# Patient Record
Sex: Male | Born: 1980 | Race: White | Hispanic: No | Marital: Single | Smoking: Current every day smoker
Health system: Southern US, Community
[De-identification: ages and names within clinical notes are randomized; demographics above are authoritative.]

---

## 2009-09-15 ENCOUNTER — Emergency Department (HOSPITAL_COMMUNITY): Admission: EM | Admit: 2009-09-15 | Discharge: 2009-09-15 | Payer: Self-pay | Admitting: Emergency Medicine

## 2015-04-09 ENCOUNTER — Encounter (HOSPITAL_COMMUNITY): Payer: Self-pay | Admitting: *Deleted

## 2015-04-09 ENCOUNTER — Emergency Department (HOSPITAL_COMMUNITY): Payer: Medicaid Other

## 2015-04-09 ENCOUNTER — Emergency Department (HOSPITAL_COMMUNITY)
Admission: EM | Admit: 2015-04-09 | Discharge: 2015-04-10 | Disposition: A | Discharge status: Home / Self Care | Payer: Medicaid Other | Attending: Emergency Medicine | Admitting: Emergency Medicine

## 2015-04-09 DIAGNOSIS — S4991XA Unspecified injury of right shoulder and upper arm, initial encounter: Secondary | ICD-10-CM

## 2015-04-09 DIAGNOSIS — Z720 Tobacco use: Secondary | ICD-10-CM | POA: Diagnosis not present

## 2015-04-09 DIAGNOSIS — Y9389 Activity, other specified: Secondary | ICD-10-CM | POA: Insufficient documentation

## 2015-04-09 DIAGNOSIS — Y92007 Garden or yard of unspecified non-institutional (private) residence as the place of occurrence of the external cause: Secondary | ICD-10-CM | POA: Insufficient documentation

## 2015-04-09 DIAGNOSIS — Y998 Other external cause status: Secondary | ICD-10-CM | POA: Insufficient documentation

## 2015-04-09 MED ORDER — IBUPROFEN 800 MG PO TABS
800.0000 mg | ORAL_TABLET | Freq: Once | ORAL | Status: AC
  Administered 2015-04-09: 800 mg via ORAL
  Filled 2015-04-09: qty 1

## 2015-04-09 MED ORDER — IBUPROFEN 800 MG PO TABS: 800.0000 mg | ORAL_TABLET | Freq: Three times a day (TID) | ORAL | Status: AC
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
## 2015-04-09 NOTE — Discharge Instructions (Signed)
Take ibuprofen as needed for pain. Wear shoulder sling as needed for support. Refer to attached documents for more information.
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
## 2015-05-01 ENCOUNTER — Emergency Department (HOSPITAL_COMMUNITY)
Admission: EM | Admit: 2015-05-01 | Discharge: 2015-05-01 | Disposition: A | Discharge status: Home / Self Care | Payer: Medicaid Other | Attending: Emergency Medicine | Admitting: Emergency Medicine

## 2015-05-01 ENCOUNTER — Encounter (HOSPITAL_COMMUNITY): Payer: Self-pay

## 2015-05-01 DIAGNOSIS — Z791 Long term (current) use of non-steroidal anti-inflammatories (NSAID): Secondary | ICD-10-CM | POA: Insufficient documentation

## 2015-05-01 DIAGNOSIS — X58XXXA Exposure to other specified factors, initial encounter: Secondary | ICD-10-CM | POA: Insufficient documentation

## 2015-05-01 DIAGNOSIS — S0501XA Injury of conjunctiva and corneal abrasion without foreign body, right eye, initial encounter: Secondary | ICD-10-CM | POA: Diagnosis not present

## 2015-05-01 DIAGNOSIS — Y990 Civilian activity done for income or pay: Secondary | ICD-10-CM | POA: Insufficient documentation

## 2015-05-01 DIAGNOSIS — S0591XA Unspecified injury of right eye and orbit, initial encounter: Secondary | ICD-10-CM | POA: Diagnosis present

## 2015-05-01 DIAGNOSIS — Z720 Tobacco use: Secondary | ICD-10-CM | POA: Insufficient documentation

## 2015-05-01 DIAGNOSIS — Y9389 Activity, other specified: Secondary | ICD-10-CM | POA: Insufficient documentation

## 2015-05-01 DIAGNOSIS — Y9289 Other specified places as the place of occurrence of the external cause: Secondary | ICD-10-CM | POA: Insufficient documentation

## 2015-05-01 MED ORDER — HYDROCODONE-ACETAMINOPHEN 5-325 MG PO TABS: 2.0000 | ORAL_TABLET | ORAL | Status: AC | PRN

## 2015-05-01 MED ORDER — CIPROFLOXACIN HCL 0.3 % OP SOLN
1.0000 [drp] | OPHTHALMIC | Status: DC
  Filled 2015-05-01: qty 2.5

## 2015-05-01 MED ORDER — PROPARACAINE HCL 0.5 % OP SOLN
OPHTHALMIC | Status: AC
  Administered 2015-05-01: 2 [drp]
  Filled 2015-05-01: qty 15

## 2015-05-01 MED ORDER — FLUORESCEIN SODIUM 1 MG OP STRP
ORAL_STRIP | OPHTHALMIC | Status: AC
  Administered 2015-05-01: 16:00:00
  Filled 2015-05-01: qty 2

## 2015-05-01 MED ORDER — ERYTHROMYCIN 5 MG/GM OP OINT
1.0000 "application " | TOPICAL_OINTMENT | Freq: Once | OPHTHALMIC | Status: AC
  Administered 2015-05-01: 1 via OPHTHALMIC
  Filled 2015-05-01: qty 3.5

## 2015-05-01 MED ORDER — TETRACAINE HCL 0.5 % OP SOLN
1.0000 [drp] | Freq: Once | OPHTHALMIC | Status: AC
  Administered 2015-05-01: 1 [drp] via OPHTHALMIC

## 2015-05-01 MED ORDER — NAPROXEN 500 MG PO TABS: 500.0000 mg | ORAL_TABLET | Freq: Two times a day (BID) | ORAL | Status: AC

## 2015-05-01 MED ORDER — TETRACAINE HCL 0.5 % OP SOLN
2.0000 [drp] | Freq: Once | OPHTHALMIC | Status: AC
  Administered 2015-05-01: 2 [drp] via OPHTHALMIC
  Filled 2015-05-01: qty 2
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
## 2015-05-01 NOTE — ED Provider Notes (Signed)
CSN: [id number]     Arrival date & time 05/01/15  1452 History   First MD Initiated Contact with Patient 05/01/15 1538     Chief Complaint  Patient presents with  . Chemical Exposure     (Consider location/radiation/quality/duration/timing/severity/associated sxs/prior Treatment) HPI Comments: The patient is a 34 year old male with no prior ophthalmologic history who presents immediately after spraying a chemical in his right eye. 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 He immediately had acute onset of pain and redness and washed his eye out at an eyewash station for over 15 minutes. The pain is now moderate, persistent, nothing makes this better, worse with palpation, associated with mild blurred vision.  He has no history of using contact lenses or corrective lenses  The history is provided by the patient.    History reviewed. No pertinent past medical history. History reviewed. No pertinent past surgical history. History reviewed. No pertinent family history. History  Substance Use Topics  . Smoking status: Current Every Day Smoker -- 1.00 packs/day    Types: Cigarettes  . Smokeless tobacco: Not on file  . Alcohol Use: Yes     Comment: socially    Review of Systems  Eyes: Positive for pain and redness.  Gastrointestinal: Negative for nausea.      Allergies  Review of patient's allergies indicates no known allergies.  Home Medications   Prior to Admission medications   Medication Sig Start Date End Date Taking? Authorizing Provider  HYDROcodone-acetaminophen (NORCO/VICODIN) 5-325 MG per tablet Take 2 tablets by mouth every 4 (four) hours as needed. 05/01/15   Eber Hong, MD  ibuprofen (ADVIL,MOTRIN) 800 MG tablet Take 1 tablet (800 mg total) by mouth 3 (three) times daily. 04/09/15   Kaitlyn Szekalski, PA-C  naproxen (NAPROSYN) 500 MG tablet Take 1  tablet (500 mg total) by mouth 2 (two) times daily with a meal. 05/01/15   Eber Hong, MD   BP 123/86 mmHg  Pulse 54  Temp(Src) 98.6 F (37 C) (Oral)  Resp 20  SpO2 100% Physical Exam  Constitutional: He appears well-developed and well-nourished.  HENT:  Head: Normocephalic and atraumatic.  Eyes: Right eye exhibits no discharge. Left eye exhibits no discharge.  Pupillary reactive bilaterally, no asymmetry, mild conjunctival injection on the right, normal extraocular movements, no a fair pupillary defect, no consensual pain, under tetracaine and floor seen exam there is a corneal abrasion on the right overlying the cornea, there is no fluid flowing from the eye  Pulmonary/Chest: Effort normal. No respiratory distress.  Neurological: He is alert. Coordination normal.  Skin: Skin is warm and dry. No rash noted. He is not diaphoretic. No erythema.  Psychiatric: He has a normal mood and affect.  Nursing note and vitals reviewed.   ED Course  Procedures (including critical care time) Labs Review Labs Reviewed - No data to display  Imaging Review No results found.   MDM   Final diagnoses:  Corneal abrasion, right, initial encounter    Left eye normal, right eye with corneal abrasion likely from the chemical, the pH in the bilateral eyes is   between 7 and 8.  Discuss with ophthalmology, erythromycin, anticipate discharge.   Dr. Dione Booze will see tomorrow at 10 AM  Meds given in ED:  Medications  fluorescein 1 MG ophthalmic strip (not administered)  ciprofloxacin (CILOXAN) 0.3 % ophthalmic solution 1 drop (  not administered)  tetracaine (PONTOCAINE) 0.5 % ophthalmic solution 1 drop (not administered)  erythromycin ophthalmic ointment 1 application (not administered)  tetracaine (PONTOCAINE) 0.5 % ophthalmic solution 2 drop (2 drops Both Eyes Given 05/01/15 1531)    New Prescriptions   HYDROCODONE-ACETAMINOPHEN (NORCO/VICODIN) 5-325 MG PER TABLET    Take 2 tablets by mouth  every 4 (four) hours as needed.   NAPROXEN (NAPROSYN) 500 MG TABLET    Take 1 tablet (500 mg total) by mouth 2 (two) times daily with a meal.      Eber HongBrian Gavriela Cashin, MD 05/01/15 [phone]

## 2015-05-01 NOTE — ED Notes (Signed)
Pt reports chemical exposure to R eye and skin while he was at work today.  Pt reports being exposed to fiberglass Resin.  Redness noted to R eye and drainage.  Pt reports blurred vision.

## 2015-05-01 NOTE — Discharge Instructions (Signed)
Please read the attached instructions.  Use the ointment on your eye every 4 hours  Please obtain all of your results from medical records or have your doctors office obtain the results - share them with your doctor - you should be seen at your doctors office in the next 2 days. Call today to arrange your follow up. Take the medications as prescribed. Please review all of the medicines and only take them if you do not have an allergy to them. Please be aware that if you are taking birth control pills, taking other prescriptions, ESPECIALLY ANTIBIOTICS may make the birth control ineffective - if this is the case, either do not engage in sexual activity or use alternative methods of birth control such as condoms until you have finished the medicine and your family doctor says it is OK to restart them. If you are on a blood thinner such as COUMADIN, be aware that any other medicine that you take may cause the coumadin to either work too much, or not enough - you should have your coumadin level rechecked in next 7 days if this is the case.  ?  It is also a possibility that you have an allergic reaction to any of the medicines that you have been prescribed - Everybody reacts differently to medications and while MOST people have no trouble with most medicines, you may have a reaction such as nausea, vomiting, rash, swelling, shortness of breath. If this is the case, please stop taking the medicine immediately and contact your physician.  ?  You should return to the ER if you develop severe or worsening symptoms.

## 2017-10-31 ENCOUNTER — Other Ambulatory Visit: Payer: Self-pay

## 2017-10-31 ENCOUNTER — Emergency Department (HOSPITAL_COMMUNITY): Payer: Self-pay

## 2017-10-31 ENCOUNTER — Encounter (HOSPITAL_COMMUNITY): Payer: Self-pay

## 2017-10-31 ENCOUNTER — Emergency Department (HOSPITAL_COMMUNITY)
Admission: EM | Admit: 2017-10-31 | Discharge: 2017-10-31 | Disposition: A | Discharge status: Home / Self Care | Payer: Self-pay | Attending: Emergency Medicine | Admitting: Emergency Medicine

## 2017-10-31 DIAGNOSIS — R160 Hepatomegaly, not elsewhere classified: Secondary | ICD-10-CM | POA: Insufficient documentation

## 2017-10-31 DIAGNOSIS — R590 Localized enlarged lymph nodes: Secondary | ICD-10-CM | POA: Insufficient documentation

## 2017-10-31 DIAGNOSIS — F17210 Nicotine dependence, cigarettes, uncomplicated: Secondary | ICD-10-CM | POA: Insufficient documentation

## 2017-10-31 LAB — URINALYSIS, ROUTINE W REFLEX MICROSCOPIC
Glucose, UA: NEGATIVE mg/dL
HGB URINE DIPSTICK: NEGATIVE
Ketones, ur: NEGATIVE mg/dL
LEUKOCYTES UA: NEGATIVE
Nitrite: NEGATIVE
PH: 6 (ref 5.0–8.0)
Protein, ur: 100 mg/dL — AB
Specific Gravity, Urine: 1.036 — ABNORMAL HIGH (ref 1.005–1.030)

## 2017-10-31 LAB — CBC WITH DIFFERENTIAL/PLATELET
BASOS PCT: 0 %
Basophils Absolute: 0 10*3/uL (ref 0.0–0.1)
Eosinophils Absolute: 0.1 10*3/uL (ref 0.0–0.7)
Eosinophils Relative: 0 %
HEMATOCRIT: 46.4 % (ref 39.0–52.0)
Hemoglobin: 16.2 g/dL (ref 13.0–17.0)
LYMPHS ABS: 2.6 10*3/uL (ref 0.7–4.0)
Lymphocytes Relative: 20 %
MCH: 33.1 pg (ref 26.0–34.0)
MCHC: 34.9 g/dL (ref 30.0–36.0)
MCV: 94.9 fL (ref 78.0–100.0)
MONOS PCT: 9 %
Monocytes Absolute: 1.1 10*3/uL — ABNORMAL HIGH (ref 0.1–1.0)
NEUTROS ABS: 9.4 10*3/uL — AB (ref 1.7–7.7)
Neutrophils Relative %: 71 %
Platelets: 316 10*3/uL (ref 150–400)
RBC: 4.89 MIL/uL (ref 4.22–5.81)
RDW: 13.3 % (ref 11.5–15.5)
WBC: 13.1 10*3/uL — ABNORMAL HIGH (ref 4.0–10.5)

## 2017-10-31 LAB — BASIC METABOLIC PANEL
Anion gap: 9 (ref 5–15)
BUN: 12 mg/dL (ref 6–20)
CALCIUM: 9.1 mg/dL (ref 8.9–10.3)
CO2: 27 mmol/L (ref 22–32)
Chloride: 102 mmol/L (ref 101–111)
Creatinine, Ser: 1.04 mg/dL (ref 0.61–1.24)
GFR calc Af Amer: >60 mL/min (ref >60)
GFR calc non Af Amer: >60 mL/min (ref >60)
Glucose, Bld: 97 mg/dL (ref 65–99)
Potassium: 4.6 mmol/L (ref 3.5–5.1)
Sodium: 138 mmol/L (ref 135–145)

## 2017-10-31 MED ORDER — IOPAMIDOL (ISOVUE-300) INJECTION 61%
INTRAVENOUS | Status: AC
  Administered 2017-10-31: 75 mL via INTRAVENOUS
  Filled 2017-10-31: qty 100

## 2017-10-31 NOTE — Discharge Planning (Signed)
Torien Ramroop J. Lucretia RoersWood, RN, BSN, [place] [phone]  [facility]00.  Spoke with pt at bedside and advised to please arrive 15 min early and take a picture ID and your current medications.  Pt verbalizes understanding of keeping appointment.

## 2017-10-31 NOTE — ED Notes (Signed)
Patient transported to CT 

## 2017-10-31 NOTE — ED Triage Notes (Signed)
Per Pt, Pt reports some pain in his right lower groin with a bulge that started on Saturday.

## 2017-10-31 NOTE — Discharge Instructions (Signed)
The area in the groin is noted to be an enlarged lymph node. There was no reason for this noted on the CT scan. Please watch the area to assure it does not worsen. Follow up with a primary care provider on this matter.  The liver was noted to be larger than normal. Follow up with the primary care provider on this matter.  Call the number provided to let them know that you will also need evaluation of an enlarged liver noted on CT and ask them if they want you to come in for lab tests before your first visit so the results can be discussed during your first visit. These may include hepatic function tests and possibly a hepatitis panel. It may also require follow-up with the gastroenterologist. Avoid alcohol and Tylenol.

## 2017-10-31 NOTE — ED Notes (Signed)
Patient returned to room from CT. 

## 2017-10-31 NOTE — ED Provider Notes (Signed)
[facility] EMERGENCY DEPARTMENT Provider Note   CSN: [id number] Arrival date & time: 10/31/17  1015     History   Chief Complaint Chief Complaint  Patient presents with  . Hernia    HPI Ronald Mcmahon is a 37 y.o. male.  HPI   Ronald Mcmahon is a 37 y.o. male, patient with no pertinent past medical history, presenting to the ED with an area of painful swelling to the right groin over the last 2 days.  Pain increases throughout the day and subsides at night.  Patient does endorse long work days restoring cars. Improved last night with naproxen and warm bath. No change in pain with squatting, increased abdominal pressure, coughing, or bowel movements.  Denies any other areas of swelling or pain.  Last tattoo was at least 2 years ago.  Denies illicit drug use other than marijuana.  States he uses alcohol less than once a month.  He does endorse about 15 pound unintentional weight loss over the last month that he attributes to stress from starting a new business.   Denies fever/chills, N/V/D, abdominal pain, scrotal pain or swelling, penile discharge, dysuria, leg pain, or any other complaints.   History reviewed. No pertinent past medical history.  There are no active problems to display for this patient.   History reviewed. No pertinent surgical history.     Home Medications    Prior to Admission medications   Medication Sig Start Date End Date Taking? Authorizing Provider  naproxen sodium (ALEVE) 220 MG tablet Take 440 mg by mouth daily as needed (pain).   Yes [provider]  HYDROcodone-acetaminophen (NORCO/VICODIN) 5-325 MG per tablet Take 2 tablets by mouth every 4 (four) hours as needed. Patient not taking: Reported on 10/31/2017 05/01/15   Eber Hong, MD  ibuprofen (ADVIL,MOTRIN) 800 MG tablet Take 1 tablet (800 mg total) by mouth 3 (three) times daily. Patient not taking: Reported on 05/01/2015 04/09/15   Emilia Beck, PA-C    naproxen (NAPROSYN) 500 MG tablet Take 1 tablet (500 mg total) by mouth 2 (two) times daily with a meal. Patient not taking: Reported on 10/31/2017 05/01/15   Eber Hong, MD    Family History No family history on file.  Social History Social History   Tobacco Use  . Smoking status: Current Every Day Smoker    Packs/day: 1.00    Types: Cigarettes  . Smokeless tobacco: Never Used  Substance Use Topics  . Alcohol use: Yes    Comment: socially  . Drug use: Yes    Types: Marijuana     Allergies   Patient has no known allergies.   Review of Systems Review of Systems  Constitutional: Negative for chills, diaphoresis and fever.  Respiratory: Negative for shortness of breath.   Cardiovascular: Negative for chest pain.  Gastrointestinal: Negative for abdominal pain, diarrhea, nausea and vomiting.  Genitourinary: Negative for discharge, dysuria, hematuria, penile swelling, scrotal swelling and testicular pain.       Swelling in the right groin.   Skin: Negative for rash.  Neurological: Negative for weakness and numbness.  All other systems reviewed and are negative.    Physical Exam Updated Vital Signs BP 115/79   Pulse 80   Temp 98.5 F (36.9 C) (Oral)   Resp 18   Ht 5\' 7"  (1.702 m)   Wt 68 kg (150 lb)   SpO2 98%   BMI 23.49 kg/m   Physical Exam  Constitutional: He appears well-developed and  well-nourished. No distress.  HENT:  Head: Normocephalic and atraumatic.  Eyes: Conjunctivae are normal. No scleral icterus.  Neck: Neck supple.  Cardiovascular: Normal rate, regular rhythm, normal heart sounds and intact distal pulses.  Pulmonary/Chest: Effort normal and breath sounds normal. No respiratory distress.  Abdominal: Soft. There is no tenderness. There is no guarding.  Genitourinary: Testes normal. Cremasteric reflex is present. Circumcised.     Genitourinary Comments: Approximately 2-2.5cm, firm, mobile, mildly tender mass in the right anterior inguinal  region. There is no change in the size of the mass or in patient's discomfort with straining, coughing, or change in position.  There is no fluctuance noted to the mass.  No hernia noted into the inguinal canal. No mass noted to the testicles.  Penis, scrotum, and testicles without swelling, lesions, or tenderness. No penile discharge. Cremasteric reflex intact. Overall normal male genitalia.  Musculoskeletal: He exhibits no edema or tenderness.  Full range of motion in the right hip and knee without pain.  No noted erythema, swelling, tenderness, or increased warmth to the right leg. No noted wounds, fresh tattoos, or other breaks in the skin.  Lymphadenopathy:    He has no cervical adenopathy. Inguinal adenopathy noted on the right side.  Neurological: He is alert.  Skin: Skin is warm and dry. Capillary refill takes less than 2 seconds. He is not diaphoretic. No pallor.  No jaundice.  Psychiatric: He has a normal mood and affect. His behavior is normal.  Nursing note and vitals reviewed.    ED Treatments / Results  Labs (all labs ordered are listed, but only abnormal results are displayed) Labs Reviewed  CBC WITH DIFFERENTIAL/PLATELET - Abnormal; Notable for the following components:      Result Value   WBC 13.1 (*)    Neutro Abs 9.4 (*)    Monocytes Absolute 1.1 (*)    All other components within normal limits  URINALYSIS, ROUTINE W REFLEX MICROSCOPIC - Abnormal; Notable for the following components:   Color, Urine AMBER (*)    APPearance CLOUDY (*)    Specific Gravity, Urine 1.036 (*)    Bilirubin Urine SMALL (*)    Protein, ur 100 (*)    Bacteria, UA RARE (*)    Squamous Epithelial / LPF 0-5 (*)    All other components within normal limits  BASIC METABOLIC PANEL    EKG  EKG Interpretation None       Radiology Ct Abdomen Pelvis W Contrast  Addendum Date: 10/31/2017   ADDENDUM REPORT: 10/31/2017 15:27 ADDENDUM: Body of the report should read "NO abdominal aortic  aneurysm". Electronically Signed   By: Lacy Duverney M.D.   On: 10/31/2017 15:27   Result Date: 10/31/2017 CLINICAL DATA:  37 year old male with right groin pain and swelling over the past 2 days. Initial encounter. EXAM: CT ABDOMEN AND PELVIS WITH CONTRAST TECHNIQUE: Multidetector CT imaging of the abdomen and pelvis was performed using the standard protocol following bolus administration of intravenous contrast. CONTRAST:  75 cc ISOVUE-300 IOPAMIDOL (ISOVUE-300) INJECTION 61% COMPARISON:  None. FINDINGS: Lower chest: Minimal basilar atelectasis. Heart size within normal limits. Hepatobiliary: Enlarged liver spanning over 19.8 cm. No worrisome hepatic lesion. No calcified gallstone or common bile duct stone. Pancreas: No pancreatic mass or inflammation. Spleen: No splenic mass or enlargement. Adrenals/Urinary Tract: No obstructing stone or hydronephrosis. Left upper pole 6 mm low-density structure too small to characterize, statistically likely a cyst. No adrenal lesion. Noncontrast filled views the urinary bladder unremarkable. Stomach/Bowel: No extraluminal bowel  inflammatory process. Under distended distal esophagus. No gastric abnormality. Vascular/Lymphatic: Abdominal aortic aneurysm. Trace calcified plaque internal iliac arteries bilaterally. Increase number of top-normal size right inguinal lymph nodes of questionable etiology. Reproductive: There may be small hydroceles Other: No free intraperitoneal air.  No bowel containing hernia. Musculoskeletal: No worrisome osseous abnormality. IMPRESSION: Increase number of top-normal size right inguinal lymph nodes of questionable etiology. No extraluminal bowel inflammatory process. No bowel containing hernia. There may be small hydroceles. Trace calcified plaque internal iliac arteries. Left renal upper pole 6 mm low-density structure too small to characterize, statistically likely a cyst. Enlarged liver spanning over 19.8 cm. Electronically Signed: By: Lacy DuverneySteven   Olson M.D. On: 10/31/2017 15:08    Procedures Procedures (including critical care time)  Medications Ordered in ED Medications  iopamidol (ISOVUE-300) 61 % injection (75 mLs Intravenous Contrast Given 10/31/17 1438)     Initial Impression / Assessment and Plan / ED Course  I have reviewed the triage vital signs and the nursing notes.  Pertinent labs & imaging results that were available during my care of the patient were reviewed by me and considered in my medical decision making (see chart for details).  Clinical Course as of Oct 31 1550  Mon Oct 31, 2017  1537 Hepatomegaly noted on CT scan. Patient has no right upper quadrant pain or tenderness.  No jaundice or muscle aches. Hepatic function testing discussed with the patient.  Patient opted to have this done through PCP.  Patient counseled to avoid alcohol and Tylenol. CT Abdomen Pelvis W Contrast [[facility]]    Clinical Course User Index [[facility]] Joy, Shawn C, PA-C    Patient presents with a tender mass to the right inguinal region.  Suspect lymphadenopathy; confirmed on CT.  No evidence of cellulitis, lymphangitis, or other abnormality to the lower extremity.  Abdominal exam benign.  Case management was used to secure PCP appointment.   Patient to follow-up with PCP versus GI for hepatomegaly noted incidentally on CT. The patient was given instructions for home care as well as return precautions. Patient voices understanding of these instructions, accepts the plan, and is comfortable with discharge.  Findings and plan of care discussed with Gwyneth SproutWhitney Plunkett, MD. Dr. Anitra LauthPlunkett personally evaluated and examined this patient.  Vitals:   10/31/17 1400 10/31/17 1500 10/31/17 1515 10/31/17 1530  BP: 132/72 120/72 122/85 117/82  Pulse: 82 61 97 69  Resp:      Temp:      TempSrc:      SpO2: 99% 98% 100% 98%  Weight:      Height:         Final Clinical Impressions(s) / ED Diagnoses   Final diagnoses:  Inguinal lymphadenopathy    Hepatomegaly    ED Discharge Orders    None       Concepcion LivingJoy, Shawn C, PA-C 10/31/17 1606    Gwyneth SproutPlunkett, Whitney, MD 11/01/17 1041

## 2017-10-31 NOTE — ED Notes (Signed)
Registration at bedside at this time.

## 2017-11-15 ENCOUNTER — Encounter: Payer: Self-pay | Admitting: Family Medicine

## 2017-11-15 ENCOUNTER — Ambulatory Visit (INDEPENDENT_AMBULATORY_CARE_PROVIDER_SITE_OTHER): Payer: Self-pay | Admitting: Family Medicine

## 2017-11-15 VITALS — BP 126/70 | HR 74 | Temp 98.8°F | Resp 14 | Ht 67.0 in | Wt 156.0 lb

## 2017-11-15 DIAGNOSIS — R160 Hepatomegaly, not elsewhere classified: Secondary | ICD-10-CM

## 2017-11-15 DIAGNOSIS — Z1329 Encounter for screening for other suspected endocrine disorder: Secondary | ICD-10-CM

## 2017-11-15 DIAGNOSIS — Z23 Encounter for immunization: Secondary | ICD-10-CM

## 2017-11-15 DIAGNOSIS — R590 Localized enlarged lymph nodes: Secondary | ICD-10-CM

## 2017-11-15 DIAGNOSIS — Z131 Encounter for screening for diabetes mellitus: Secondary | ICD-10-CM

## 2017-11-15 LAB — POCT URINALYSIS DIP (DEVICE)
BILIRUBIN URINE: NEGATIVE
Glucose, UA: NEGATIVE mg/dL
HGB URINE DIPSTICK: NEGATIVE
Ketones, ur: NEGATIVE mg/dL
Leukocytes, UA: NEGATIVE
Nitrite: NEGATIVE
Protein, ur: NEGATIVE mg/dL
Specific Gravity, Urine: 1.015 (ref 1.005–1.030)
Urobilinogen, UA: 0.2 mg/dL (ref 0.0–1.0)
pH: 7 (ref 5.0–8.0)

## 2017-11-15 LAB — POCT GLYCOSYLATED HEMOGLOBIN (HGB A1C): Hemoglobin A1C: 5.6

## 2017-11-15 NOTE — Progress Notes (Signed)
Patient ID: Ronald Mcmahon, male    DOB: Feb 25, 1981, 37 y.o.   MRN: [id number]  PCP: Bing NeighborsHarris, Mako Pelfrey S, FNP  Chief Complaint  Patient presents with  . Establish Care  . Hospitalization Follow-up    Subjective:  HPI Ronald Mcmahon is a [age] y.o. male without significant medical history, current daily smoker, presents to establish care and for ED follow-up.  No routine primary preventative care in several years. On 10/31/2017 Ronald BootyJoshua presented to [facility] with a complaint of a hernia and was found to have  inguinal lymphadenopathy and hepatomegaly. Patient was found to have elevated liver enzymes. Within the last he reports losing weight without trying although attributed this weight loss to stress. He has a history of "heavy alcohol use" although denies any history of alcohol abuse, substance abuse, and or high risk sexual behavior. Reports being in a current long-term relationship no ne sexual partners. He denies any recent bruising, skin color changes, stool color changes, or abdominal pain. Reports bleeding of gums recently however reports he needs dental care. No known history of liver disease. Reports that inguinal lymphadenopathy has resolved.  Social History   Socioeconomic History  . Marital status: Single    Spouse name: Not on file  . Number of children: Not on file  . Years of education: Not on file  . Highest education level: Not on file  Social Needs  . Financial resource strain: Not on file  . Food insecurity - worry: Not on file  . Food insecurity - inability: Not on file  . Transportation needs - medical: Not on file  . Transportation needs - non-medical: Not on file  Occupational History  . Not on file  Tobacco Use  . Smoking status: Current Every Day Smoker    Packs/day: 1.00    Types: Cigarettes  . Smokeless tobacco: Never Used  Substance and Sexual Activity  . Alcohol use: Yes    Comment: socially  . Drug use: Yes    Types: Marijuana  . Sexual activity:  Not on file  Other Topics Concern  . Not on file  Social History Narrative  . Not on file    Family History  Problem Relation Age of Onset  . Hypercalcemia Mother   . Diabetes Father   . Hypertension Father   . Alzheimer's disease Maternal Grandfather    Review of Systems  Constitutional: Negative for fever, chills, diaphoresis, activity change, appetite change and fatigue. HENT: Negative for ear pain, nosebleeds, congestion, facial swelling, rhinorrhea, neck pain, neck stiffness and ear discharge.  Eyes: Negative for pain, discharge, redness, itching and visual disturbance. Respiratory: Negative for cough, choking, chest tightness, shortness of breath, wheezing and stridor.  Cardiovascular: Negative for chest pain, palpitations and leg swelling. Gastrointestinal: Negative for abdominal distention. Genitourinary: Negative for dysuria, urgency, frequency, hematuria, flank pain, decreased urine volume, difficulty urinating and dyspareunia.  Musculoskeletal: Negative for back pain, joint swelling, arthralgia and gait problem. Neurological: Negative for dizziness, tremors, seizures, syncope, facial asymmetry, speech difficulty, weakness, light-headedness, numbness and headaches.  Hematological: Negative for adenopathy. Does not bruise/bleed easily. Psychiatric/Behavioral: Negative for hallucinations, behavioral problems, confusion, dysphoric mood, decreased concentration and agitation.   No Known Allergies  Prior to Admission medications   Medication Sig Start Date End Date Taking? Authorizing Provider  naproxen sodium (ALEVE) 220 MG tablet Take 440 mg by mouth daily as needed (pain).   Yes [provider]  HYDROcodone-acetaminophen (NORCO/VICODIN) 5-325 MG per tablet Take 2 tablets by  mouth every 4 (four) hours as needed. Patient not taking: Reported on 10/31/2017 05/01/15   Eber Hong, MD  ibuprofen (ADVIL,MOTRIN) 800 MG tablet Take 1 tablet (800 mg total) by mouth 3 (three)  times daily. Patient not taking: Reported on 05/01/2015 04/09/15   Emilia Beck, PA-C  naproxen (NAPROSYN) 500 MG tablet Take 1 tablet (500 mg total) by mouth 2 (two) times daily with a meal. Patient not taking: Reported on 11/15/2017 05/01/15   Eber Hong, MD    Past Medical, Surgical Family and Social History reviewed and updated.    Objective:   Today's Vitals   11/15/17 1309  BP: 126/70  Pulse: 74  Resp: 14  Temp: 98.8 F (37.1 C)  TempSrc: Oral  SpO2: 98%  Weight: 156 lb (70.8 kg)  Height: 5\' 7"  (1.702 m)    Wt Readings from Last 3 Encounters:  11/15/17 156 lb (70.8 kg)  10/31/17 150 lb (68 kg)    Physical Exam Constitutional: Patient appears well-developed and well-nourished. No distress. HENT: Normocephalic, atraumatic, External right and left ear normal. Oropharynx is clear and moist.  Eyes: Conjunctivae and EOM are normal. PERRLA, no scleral icterus. Neck: Normal ROM. Neck supple. No JVD. No tracheal deviation. No thyromegaly. CVS: RRR, S1/S2 +, no murmurs, no gallops, no carotid bruit.  Pulmonary: Effort and breath sounds normal, no stridor, rhonchi, wheezes, rales.  Abdominal: Soft. BS +, no distension, tenderness, rebound or guarding.  Musculoskeletal: Normal range of motion. No edema and no tenderness.  Lymphadenopathy: No lymphadenopathy -inguinal or cervical  Neuro: Alert. Normal reflexes, muscle tone coordination. No cranial nerve deficit. Skin: Skin is warm and dry. No rash noted. Not diaphoretic. No erythema. Multiple skin tattoos. Psychiatric: Normal mood and affect. Behavior, judgment, thought content normal.   Assessment & Plan:  1. Enlarged liver will evaluate a CMP to ensure liver enzymes are normal.  Also will screen for hep B and hep C, although patient has limited risk factors with the exception of having multiple tattoos.  If liver enzymes are abnormal, will obtain a liver ultrasound to rule out any underlying pathology.  Patient is  asymptomatic today negative of abdominal pain or symptoms concerning for liver disease.   2. Screening for diabetes mellitus, A1C 5.6.  3. Screening for thyroid disorder,  Thyroid Panel With TSH  4. Inguinal adenopathy, will rule out STI's with a GC and Chlamydia probe.  Also screening for HIV.  5. Need for Tdap vaccination- Tdap vaccine greater than or equal to 7yo IM  Orders Placed This Encounter  Procedures  . GC/Chlamydia Probe Amp([facility])  . Tdap vaccine greater than or equal to 7yo IM  . Hepatitis B surface antibody  . Hepatitis B surface antigen  . Hepatitis c vrs RNA detect by PCR-qual  . HIV antibody (with reflex)  . CBC with Differential  . Comprehensive metabolic panel  . Thyroid Panel With TSH  . POCT glycosylated hemoglobin (Hb A1C)  . POCT urinalysis dip (device)   Return for follow-up in 6 months for complete physical exam.   Godfrey Pick. Tiburcio Pea, MSN, FNP-C [facility]  [address] [place], [place] [address] [phone]

## 2017-11-15 NOTE — Patient Instructions (Signed)
Hepatomegaly Hepatomegaly is when the liver is larger than normal or is enlarged. Some health problems can cause the liver to get bigger. Some people have an enlarged liver, but they do not know it. Follow these instructions at home: What you need to do at home depends on what is causing the condition. In general:  Take over-the-counter and prescription medicines only as told by your doctor.  Do not take any new medicine unless your doctor says it is okay. This includes over-the-counter medicines, supplements, and herbal remedies. Some of these can hurt your liver.  Stay at a healthy weight.  Follow a healthy diet. Eat a lot of: ? Fruit. ? Vegetables. ? Whole grains.  Do not drink alcohol.  Do not use any tobacco products, including cigarettes, chewing tobacco, and e-cigarettes. If you need help quitting, ask your doctor.  Keep all follow-up visits as told by your doctor. This is important.  Contact a doctor if:  Your belly (abdominal) pain gets worse.  You cannot stop throwing up (vomiting).  You have new symptoms.  Your symptoms get worse. Get help right away if:  You throw up bright red blood or blood that looks like coffee grounds.  You have chest pain.  You have trouble breathing. This information is not intended to replace advice given to you by your health care provider. Make sure you discuss any questions you have with your health care provider. Document Released: 09/23/2011 Document Revised: 03/11/2016 Document Reviewed: 09/30/2014 Elsevier Interactive Patient Education  [employer]2018 [employer] Inc[employer]

## 2017-11-17 ENCOUNTER — Telehealth: Payer: Self-pay | Admitting: Family Medicine

## 2017-11-17 LAB — COMPREHENSIVE METABOLIC PANEL
ALT: 17 IU/L (ref 0–44)
AST: 16 IU/L (ref 0–40)
Albumin/Globulin Ratio: 1.9 (ref 1.2–2.2)
Albumin: 4.7 g/dL (ref 3.5–5.5)
Alkaline Phosphatase: 62 IU/L (ref 39–117)
BUN / CREAT RATIO: 15 (ref 9–20)
BUN: 14 mg/dL (ref 6–20)
Bilirubin Total: 0.4 mg/dL (ref 0.0–1.2)
CO2: 28 mmol/L (ref 20–29)
CREATININE: 0.94 mg/dL (ref 0.76–1.27)
Calcium: 9.8 mg/dL (ref 8.7–10.2)
Chloride: 100 mmol/L (ref 96–106)
GFR calc Af Amer: 122 mL/min/{1.73_m2} (ref >59)
GFR calc non Af Amer: 105 mL/min/{1.73_m2} (ref >59)
GLUCOSE: 77 mg/dL (ref 65–99)
Globulin, Total: 2.5 g/dL (ref 1.5–4.5)
Potassium: 4.8 mmol/L (ref 3.5–5.2)
Sodium: 142 mmol/L (ref 134–144)
Total Protein: 7.2 g/dL (ref 6.0–8.5)

## 2017-11-17 LAB — CBC WITH DIFFERENTIAL/PLATELET
BASOS ABS: 0 10*3/uL (ref 0.0–0.2)
Basos: 0 %
EOS (ABSOLUTE): 0.1 10*3/uL (ref 0.0–0.4)
Eos: 1 %
Hematocrit: 45.8 % (ref 37.5–51.0)
Hemoglobin: 16.1 g/dL (ref 13.0–17.7)
IMMATURE GRANULOCYTES: 0 %
Immature Grans (Abs): 0 10*3/uL (ref 0.0–0.1)
LYMPHS ABS: 3.4 10*3/uL — AB (ref 0.7–3.1)
Lymphs: 38 %
MCH: 32.9 pg (ref 26.6–33.0)
MCHC: 35.2 g/dL (ref 31.5–35.7)
MCV: 94 fL (ref 79–97)
Monocytes Absolute: 0.8 10*3/uL (ref 0.1–0.9)
Monocytes: 8 %
NEUTROS PCT: 53 %
Neutrophils Absolute: 4.8 10*3/uL (ref 1.4–7.0)
Platelets: 336 10*3/uL (ref 150–379)
RBC: 4.89 x10E6/uL (ref 4.14–5.80)
RDW: 14 % (ref 12.3–15.4)
WBC: 9.1 10*3/uL (ref 3.4–10.8)

## 2017-11-17 LAB — THYROID PANEL WITH TSH
FREE THYROXINE INDEX: 2 (ref 1.2–4.9)
T3 Uptake Ratio: 27 % (ref 24–39)
T4, Total: 7.4 ug/dL (ref 4.5–12.0)
TSH: 1.28 u[IU]/mL (ref 0.450–4.500)

## 2017-11-17 LAB — HIV ANTIBODY (ROUTINE TESTING W REFLEX): HIV SCREEN 4TH GENERATION: NONREACTIVE

## 2017-11-17 LAB — HEPATITIS B SURFACE ANTIGEN: Hepatitis B Surface Ag: NEGATIVE

## 2017-11-17 LAB — GC/CHLAMYDIA PROBE AMP
CHLAMYDIA, DNA PROBE: NEGATIVE
NEISSERIA GONORRHOEAE BY PCR: NEGATIVE

## 2017-11-17 LAB — HEPATITIS C VRS RNA DETECT BY PCR-QUAL: HCV RNA NAA Qualitative: NEGATIVE

## 2017-11-17 LAB — HEPATITIS B SURFACE ANTIBODY, QUANTITATIVE: Hepatitis B Surf Ab Quant: <3.1 m[IU]/mL — ABNORMAL LOW (ref >9.9)

## 2017-11-17 NOTE — Telephone Encounter (Signed)
Patient notified of results.

## 2017-11-17 NOTE — Telephone Encounter (Signed)
Contact patient to advise all of his labs were within normal range.  I recommend that he obtains a hepatitis B series as he does not have immunity to hep B per his recent labs.  Godfrey PickKimberly S. Tiburcio PeaHarris, MSN, FNP-C [facility]  [address] [place], [place] [address] [phone]

## 2018-05-15 ENCOUNTER — Ambulatory Visit: Payer: Medicaid Other | Admitting: Family Medicine

## 2019-11-04 IMAGING — CT CT ABD-PELV W/ CM
2 of 5 series · 16 of 46 positions shown, 18 images · IV contrast (APPLIED)
Comparison: None.

ADDENDUM:
Body of the report should read "NO abdominal aortic aneurysm".
CLINICAL DATA: 36-year-old male with right groin pain and swelling
over the past 2 days. Initial encounter.

EXAM:
CT ABDOMEN AND PELVIS WITH CONTRAST
TECHNIQUE: Multidetector CT imaging of the abdomen and pelvis was performed
using the standard protocol following bolus administration of
intravenous contrast.
CONTRAST:  75 cc OBQEMS-1LL IOPAMIDOL (OBQEMS-1LL) INJECTION 61%

[Series 3: abdomen 5.0 · axial · 0.67mm/px · z∈[+760,+1125]mm · 13 of 85 slices shown, 15 images]
[im 6/85  soft-tissue]
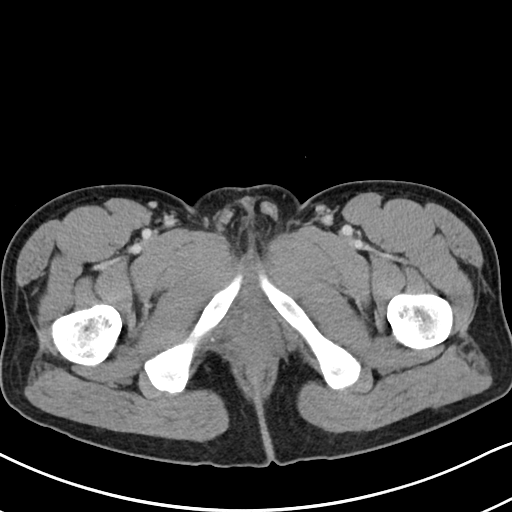
[im 6/85  bone]
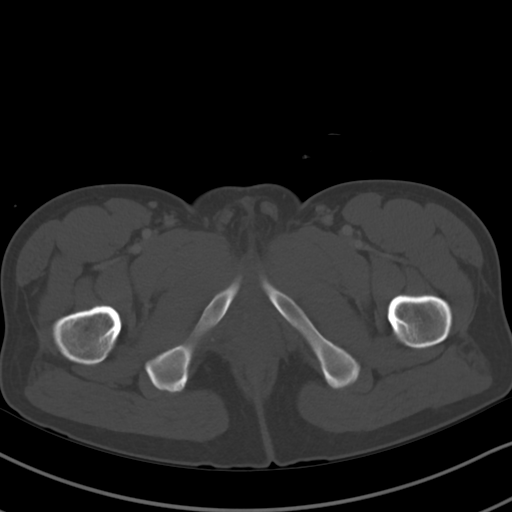
[im 11/85  soft-tissue]
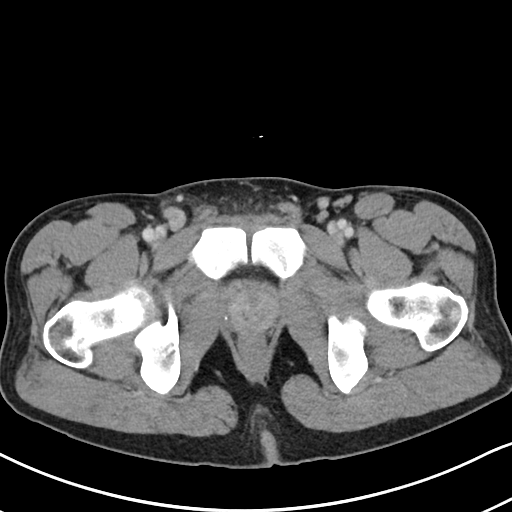
[im 16/85  soft-tissue]
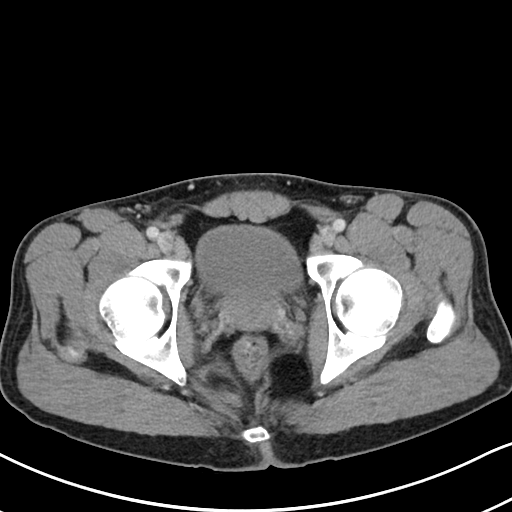
[im 27/85  soft-tissue]
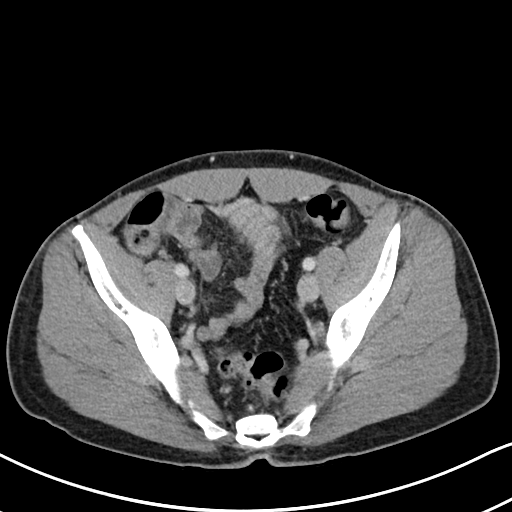
[im 32/85  soft-tissue]
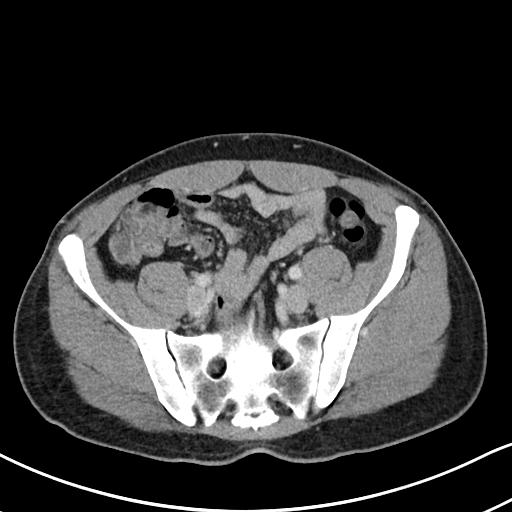
[im 37/85  soft-tissue]
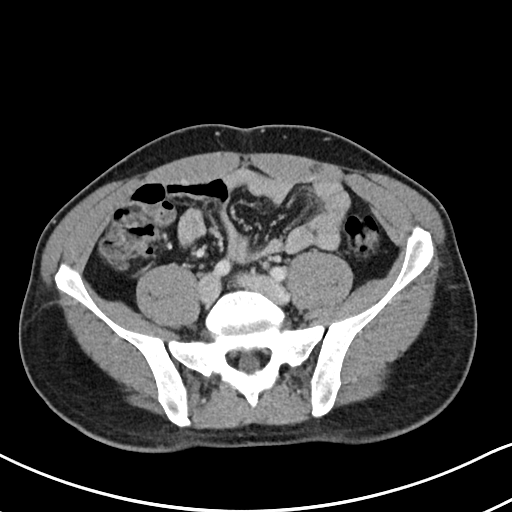
[im 43/85  soft-tissue]
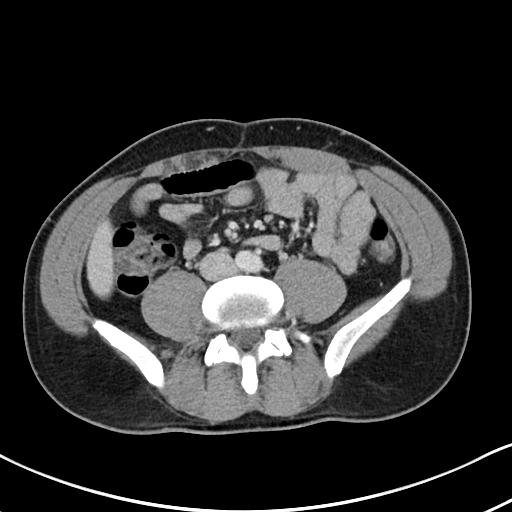
[im 48/85  soft-tissue]
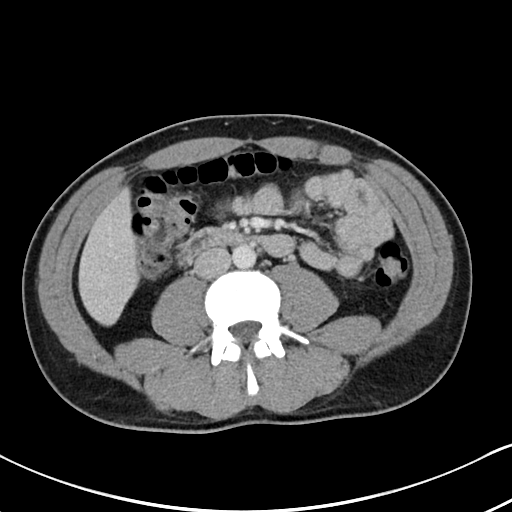
[im 53/85  soft-tissue]
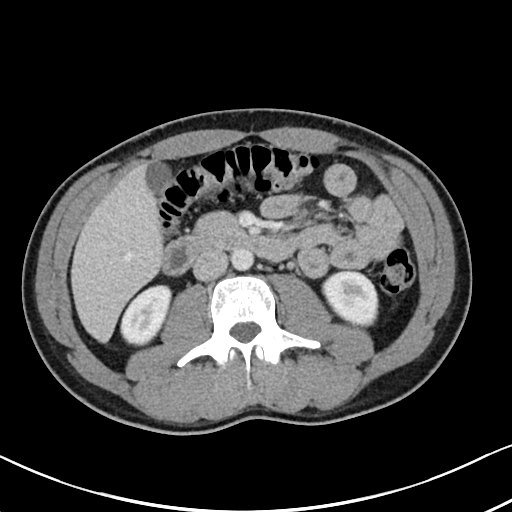
[im 53/85  bone]
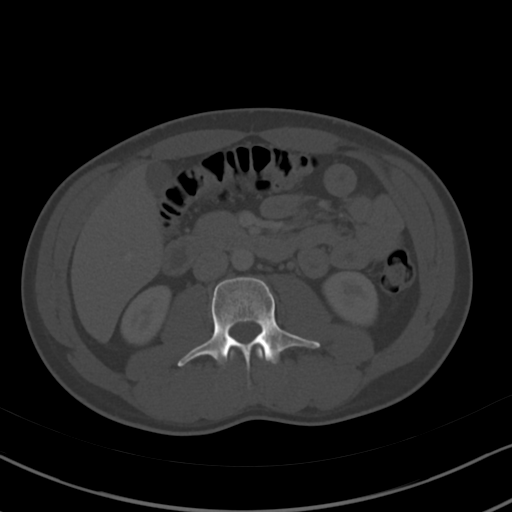
[im 58/85  soft-tissue]
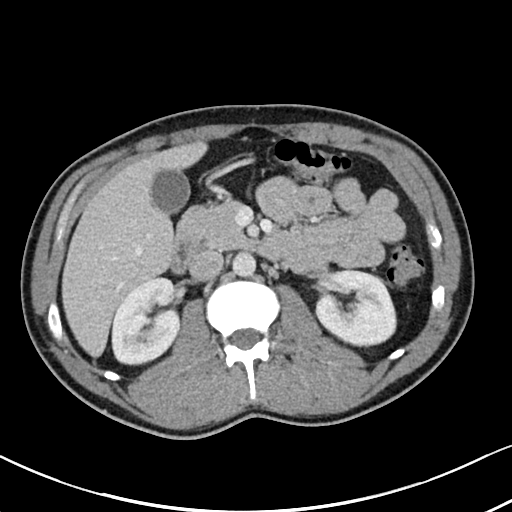
[im 69/85  soft-tissue]
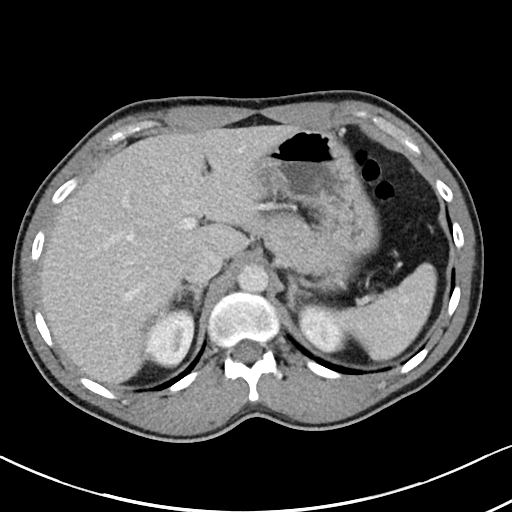
[im 74/85  soft-tissue]
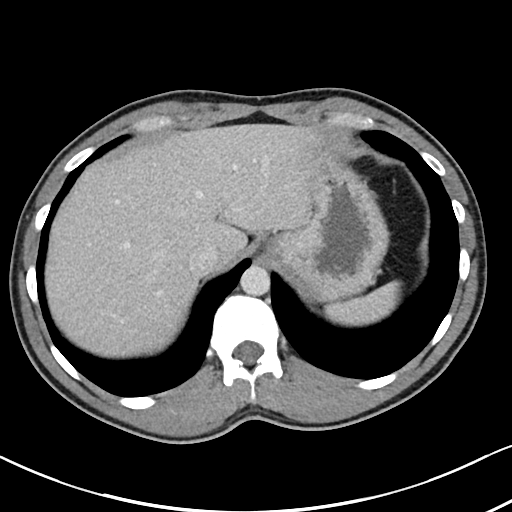
[im 79/85  soft-tissue]
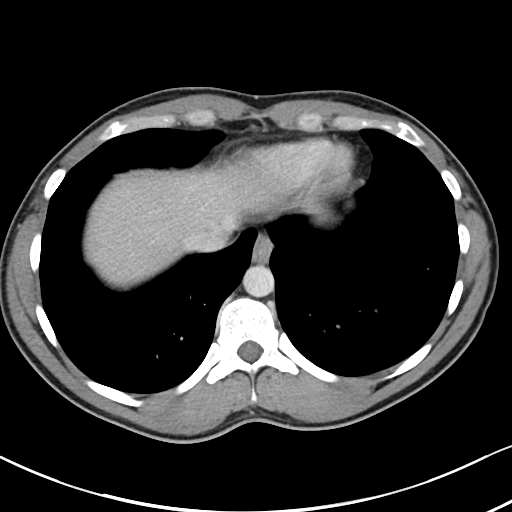

[Series 602: coronals · coronal · 0.83mm/px · 3 of 81 slices shown]
[im 27/81  soft-tissue]
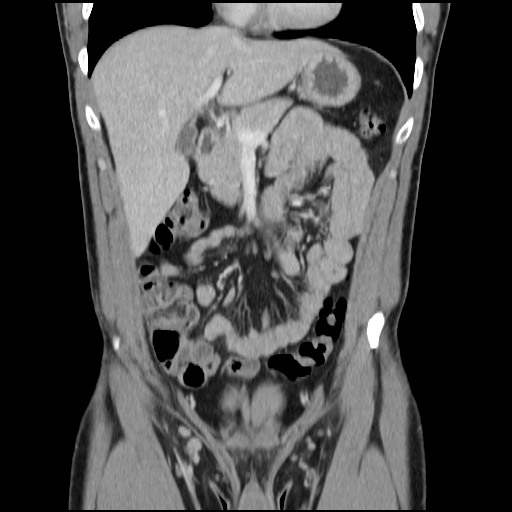
[im 36/81  soft-tissue]
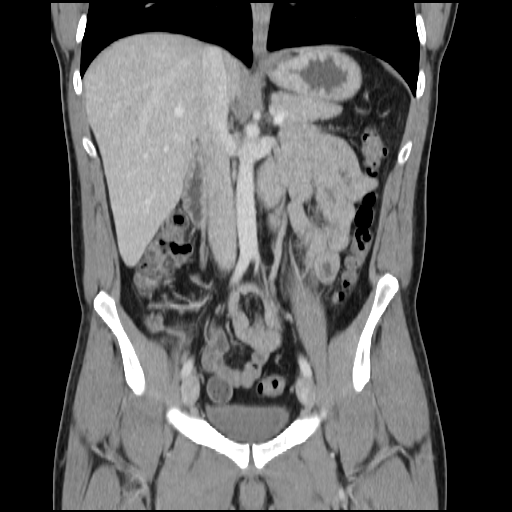
[im 45/81  soft-tissue]
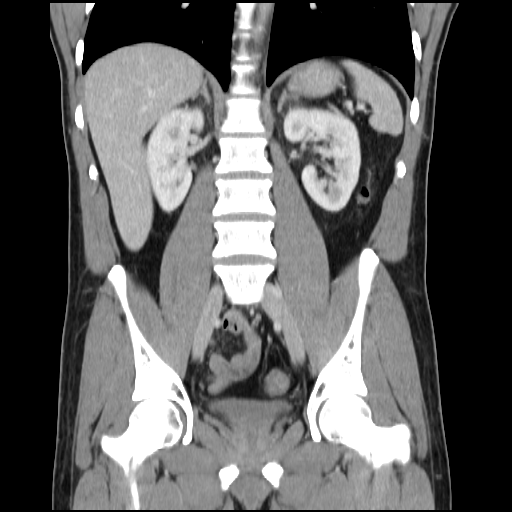

[16 of 46 positions shown; findings below may reference images not displayed]

FINDINGS: Lower chest: Minimal basilar atelectasis. Heart size within normal
limits.

Hepatobiliary: Enlarged liver spanning over 19.8 cm. No worrisome
hepatic lesion. No calcified gallstone or common bile duct stone.

Pancreas: No pancreatic mass or inflammation.

Spleen: No splenic mass or enlargement.

Adrenals/Urinary Tract: No obstructing stone or hydronephrosis. Left
upper pole 6 mm low-density structure too small to characterize,
statistically likely a cyst.

No adrenal lesion.

Noncontrast filled views the urinary bladder unremarkable.

Stomach/Bowel: No extraluminal bowel inflammatory process. Under
distended distal esophagus. No gastric abnormality.

Vascular/Lymphatic: Abdominal aortic aneurysm. Trace calcified
plaque internal iliac arteries bilaterally.

Increase number of top-normal size right inguinal lymph nodes of
questionable etiology.

Reproductive: There may be small hydroceles

Other: No free intraperitoneal air.  No bowel containing hernia.

Musculoskeletal: No worrisome osseous abnormality.
IMPRESSION: Increase number of top-normal size right inguinal lymph nodes of
questionable etiology.

No extraluminal bowel inflammatory process.

No bowel containing hernia.

There may be small hydroceles.

Trace calcified plaque internal iliac arteries.

Left renal upper pole 6 mm low-density structure too small to
characterize, statistically likely a cyst.

Enlarged liver spanning over 19.8 cm.
